# Patient Record
Sex: Male | Born: 1990 | Race: White | Hispanic: No | Marital: Single | Smoking: Never smoker
Health system: Southern US, Community
[De-identification: ages and names within clinical notes are randomized; demographics above are authoritative.]

---

## 2018-01-11 ENCOUNTER — Emergency Department (HOSPITAL_COMMUNITY): Payer: Self-pay | Admitting: Anesthesiology

## 2018-01-11 ENCOUNTER — Encounter (HOSPITAL_COMMUNITY)
Admission: EM | Disposition: A | Discharge status: Home / Self Care | Payer: Self-pay | Source: Home / Self Care | Attending: Emergency Medicine

## 2018-01-11 ENCOUNTER — Other Ambulatory Visit: Payer: Self-pay

## 2018-01-11 ENCOUNTER — Emergency Department (HOSPITAL_COMMUNITY): Payer: Self-pay

## 2018-01-11 ENCOUNTER — Encounter (HOSPITAL_COMMUNITY): Payer: Self-pay | Admitting: *Deleted

## 2018-01-11 ENCOUNTER — Ambulatory Visit (HOSPITAL_COMMUNITY)
Admission: EM | Admit: 2018-01-11 | Discharge: 2018-01-11 | Disposition: A | Discharge status: Home / Self Care | Payer: Self-pay | Attending: Emergency Medicine | Admitting: Emergency Medicine

## 2018-01-11 DIAGNOSIS — S3994XA Unspecified injury of external genitals, initial encounter: Secondary | ICD-10-CM

## 2018-01-11 DIAGNOSIS — S71132A Puncture wound without foreign body, left thigh, initial encounter: Secondary | ICD-10-CM | POA: Insufficient documentation

## 2018-01-11 DIAGNOSIS — S3123XA Puncture wound without foreign body of penis, initial encounter: Secondary | ICD-10-CM | POA: Insufficient documentation

## 2018-01-11 DIAGNOSIS — W3400XA Accidental discharge from unspecified firearms or gun, initial encounter: Secondary | ICD-10-CM | POA: Insufficient documentation

## 2018-01-11 HISTORY — PX: MEATOTOMY: SHX5133

## 2018-01-11 LAB — I-STAT CHEM 8, ED
BUN: 17 mg/dL (ref 6–20)
CALCIUM ION: 1.07 mmol/L — AB (ref 1.15–1.40)
CHLORIDE: 102 mmol/L (ref 98–111)
Creatinine, Ser: 1 mg/dL (ref 0.61–1.24)
Glucose, Bld: 188 mg/dL — ABNORMAL HIGH (ref 70–99)
HEMATOCRIT: 44 % (ref 39.0–52.0)
Hemoglobin: 15 g/dL (ref 13.0–17.0)
POTASSIUM: 3 mmol/L — AB (ref 3.5–5.1)
SODIUM: 138 mmol/L (ref 135–145)
TCO2: 21 mmol/L — ABNORMAL LOW (ref 22–32)

## 2018-01-11 LAB — HEMOGLOBIN: Hemoglobin: 13.3 g/dL (ref 13.0–17.0)

## 2018-01-11 SURGERY — MEATOTOMY, URETHRA, ADULT
Anesthesia: General

## 2018-01-11 MED ORDER — ROCURONIUM BROMIDE 50 MG/5ML IV SOSY
PREFILLED_SYRINGE | INTRAVENOUS | Status: AC
  Filled 2018-01-11: qty 5

## 2018-01-11 MED ORDER — LIDOCAINE 2% (20 MG/ML) 5 ML SYRINGE
INTRAMUSCULAR | Status: DC | PRN
  Administered 2018-01-11: 60 mg via INTRAVENOUS

## 2018-01-11 MED ORDER — ARTIFICIAL TEARS OPHTHALMIC OINT
TOPICAL_OINTMENT | OPHTHALMIC | Status: AC
  Filled 2018-01-11: qty 3.5

## 2018-01-11 MED ORDER — FENTANYL CITRATE (PF) 250 MCG/5ML IJ SOLN
INTRAMUSCULAR | Status: AC
  Filled 2018-01-11: qty 5

## 2018-01-11 MED ORDER — ONDANSETRON HCL 4 MG/2ML IJ SOLN
INTRAMUSCULAR | Status: DC | PRN
  Administered 2018-01-11: 4 mg via INTRAVENOUS

## 2018-01-11 MED ORDER — SUCCINYLCHOLINE CHLORIDE 20 MG/ML IJ SOLN
INTRAMUSCULAR | Status: DC | PRN
  Administered 2018-01-11: 120 mg via INTRAVENOUS

## 2018-01-11 MED ORDER — FENTANYL CITRATE (PF) 100 MCG/2ML IJ SOLN
25.0000 ug | INTRAMUSCULAR | Status: DC | PRN
  Administered 2018-01-11: 50 ug via INTRAVENOUS

## 2018-01-11 MED ORDER — OXYCODONE HCL 5 MG PO TABS: 5.0000 mg | ORAL_TABLET | Freq: Once | ORAL | Status: DC | PRN

## 2018-01-11 MED ORDER — LIDOCAINE 2% (20 MG/ML) 5 ML SYRINGE
INTRAMUSCULAR | Status: AC
  Filled 2018-01-11: qty 5

## 2018-01-11 MED ORDER — PROPOFOL 10 MG/ML IV BOLUS
INTRAVENOUS | Status: AC
  Filled 2018-01-11: qty 20

## 2018-01-11 MED ORDER — BACITRACIN-NEOMYCIN-POLYMYXIN OINTMENT TUBE
TOPICAL_OINTMENT | CUTANEOUS | Status: DC | PRN
  Administered 2018-01-11: 1 via TOPICAL

## 2018-01-11 MED ORDER — MIDAZOLAM HCL 5 MG/5ML IJ SOLN
INTRAMUSCULAR | Status: DC | PRN
  Administered 2018-01-11 (×2): 1 mg via INTRAVENOUS

## 2018-01-11 MED ORDER — OXYCODONE-ACETAMINOPHEN 5-325 MG PO TABS: 1.0000 | ORAL_TABLET | Freq: Four times a day (QID) | ORAL | 0 refills | Status: AC | PRN

## 2018-01-11 MED ORDER — POTASSIUM CHLORIDE CRYS ER 20 MEQ PO TBCR: 40.0000 meq | EXTENDED_RELEASE_TABLET | Freq: Once | ORAL | Status: DC

## 2018-01-11 MED ORDER — CEFAZOLIN SODIUM-DEXTROSE 2-4 GM/100ML-% IV SOLN
INTRAVENOUS | Status: AC
  Filled 2018-01-11: qty 100

## 2018-01-11 MED ORDER — SUCCINYLCHOLINE CHLORIDE 200 MG/10ML IV SOSY
PREFILLED_SYRINGE | INTRAVENOUS | Status: AC
  Filled 2018-01-11: qty 10

## 2018-01-11 MED ORDER — LACTATED RINGERS IV SOLN
INTRAVENOUS | Status: DC
  Administered 2018-01-11: 19:00:00 via INTRAVENOUS

## 2018-01-11 MED ORDER — OXYCODONE-ACETAMINOPHEN 5-325 MG PO TABS
2.0000 | ORAL_TABLET | Freq: Once | ORAL | Status: AC
  Administered 2018-01-11: 2 via ORAL
  Filled 2018-01-11: qty 2

## 2018-01-11 MED ORDER — MIDAZOLAM HCL 2 MG/2ML IJ SOLN
INTRAMUSCULAR | Status: AC
  Filled 2018-01-11: qty 2

## 2018-01-11 MED ORDER — FENTANYL CITRATE (PF) 100 MCG/2ML IJ SOLN
INTRAMUSCULAR | Status: DC | PRN
  Administered 2018-01-11 (×3): 50 ug via INTRAVENOUS

## 2018-01-11 MED ORDER — CEFAZOLIN (ANCEF) 1 G IV SOLR: 2.0000 g | INTRAVENOUS | Status: DC

## 2018-01-11 MED ORDER — PROPOFOL 10 MG/ML IV BOLUS
INTRAVENOUS | Status: DC | PRN
  Administered 2018-01-11: 180 mg via INTRAVENOUS

## 2018-01-11 MED ORDER — ONDANSETRON HCL 4 MG/2ML IJ SOLN: 4.0000 mg | Freq: Four times a day (QID) | INTRAMUSCULAR | Status: DC | PRN

## 2018-01-11 MED ORDER — LACTATED RINGERS IV SOLN
INTRAVENOUS | Status: DC | PRN
  Administered 2018-01-11: 19:00:00 via INTRAVENOUS

## 2018-01-11 MED ORDER — FENTANYL CITRATE (PF) 100 MCG/2ML IJ SOLN
INTRAMUSCULAR | Status: AC
  Filled 2018-01-11: qty 2

## 2018-01-11 MED ORDER — BACITRACIN-NEOMYCIN-POLYMYXIN 400-5-5000 EX OINT
TOPICAL_OINTMENT | CUTANEOUS | Status: AC
  Filled 2018-01-11: qty 1

## 2018-01-11 MED ORDER — 0.9 % SODIUM CHLORIDE (POUR BTL) OPTIME
TOPICAL | Status: DC | PRN
  Administered 2018-01-11: 1000 mL

## 2018-01-11 MED ORDER — CEFAZOLIN SODIUM-DEXTROSE 2-3 GM-%(50ML) IV SOLR
INTRAVENOUS | Status: DC | PRN
  Administered 2018-01-11: 2 g via INTRAVENOUS

## 2018-01-11 MED ORDER — DEXAMETHASONE SODIUM PHOSPHATE 10 MG/ML IJ SOLN
INTRAMUSCULAR | Status: DC | PRN
  Administered 2018-01-11: 10 mg via INTRAVENOUS

## 2018-01-11 MED ORDER — FENTANYL CITRATE (PF) 100 MCG/2ML IJ SOLN
100.0000 ug | Freq: Once | INTRAMUSCULAR | Status: AC
  Administered 2018-01-11: 100 ug via INTRAVENOUS
  Filled 2018-01-11: qty 2

## 2018-01-11 MED ORDER — OXYCODONE HCL 5 MG/5ML PO SOLN: 5.0000 mg | Freq: Once | ORAL | Status: DC | PRN

## 2018-01-11 MED ORDER — TETANUS-DIPHTH-ACELL PERTUSSIS 5-2.5-18.5 LF-MCG/0.5 IM SUSP
0.5000 mL | Freq: Once | INTRAMUSCULAR | Status: AC
  Administered 2018-01-11: 0.5 mL via INTRAMUSCULAR
  Filled 2018-01-11: qty 0.5

## 2018-01-11 SURGICAL SUPPLY — 26 items
BNDG GAUZE ELAST 4 BULKY (GAUZE/BANDAGES/DRESSINGS) ×3 IMPLANT
DRSG TELFA 3X8 NADH (GAUZE/BANDAGES/DRESSINGS) ×3 IMPLANT
ELECT REM PT RETURN 9FT ADLT (ELECTROSURGICAL) ×3
ELECTRODE REM PT RTRN 9FT ADLT (ELECTROSURGICAL) ×1 IMPLANT
GAUZE SPONGE 4X4 12PLY STRL (GAUZE/BANDAGES/DRESSINGS) ×3 IMPLANT
GLOVE BIO SURGEON STRL SZ7.5 (GLOVE) ×3 IMPLANT
GLOVE BIOGEL PI IND STRL 6.5 (GLOVE) ×1 IMPLANT
GLOVE BIOGEL PI IND STRL 7.5 (GLOVE) ×1 IMPLANT
GLOVE BIOGEL PI INDICATOR 6.5 (GLOVE) ×2
GLOVE BIOGEL PI INDICATOR 7.5 (GLOVE) ×2
GLOVE INDICATOR 7.0 STRL GRN (GLOVE) ×3 IMPLANT
GLOVE SURG SS PI 7.0 STRL IVOR (GLOVE) ×3 IMPLANT
GOWN STRL REUS W/ TWL LRG LVL3 (GOWN DISPOSABLE) ×1 IMPLANT
GOWN STRL REUS W/ TWL XL LVL3 (GOWN DISPOSABLE) ×1 IMPLANT
GOWN STRL REUS W/TWL LRG LVL3 (GOWN DISPOSABLE) ×2
GOWN STRL REUS W/TWL XL LVL3 (GOWN DISPOSABLE) ×2
KIT BASIN OR (CUSTOM PROCEDURE TRAY) ×3 IMPLANT
KIT TURNOVER KIT B (KITS) ×3 IMPLANT
NS IRRIG 1000ML POUR BTL (IV SOLUTION) ×3 IMPLANT
PACK GENERAL/GYN (CUSTOM PROCEDURE TRAY) ×3 IMPLANT
SUT VIC AB 4-0 SH 27 (SUTURE) ×2
SUT VIC AB 4-0 SH 27XBRD (SUTURE) ×1 IMPLANT
TAPE CLOTH SURG 6X10 WHT LF (GAUZE/BANDAGES/DRESSINGS) ×3 IMPLANT
TOWEL NATURAL 10PK STERILE (DISPOSABLE) ×3 IMPLANT
TRAY FOLEY W/BAG SLVR 14FR (SET/KITS/TRAYS/PACK) ×3 IMPLANT
WATER STERILE IRR 1000ML POUR (IV SOLUTION) ×3 IMPLANT
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
## 2018-01-11 NOTE — ED Notes (Signed)
BP in LUE 123/77 BP in LLE 157/73    Left ABI: 1.276    BP RLE 146/72 BP RUE 123/70  Right ABI: 1.186

## 2018-01-11 NOTE — Progress Notes (Signed)
   Pt soaking thigh dressing in PACU. I went and spoke with Dr. Wyatt who wilLindie Sprucel look at pt when he gets out of OR. Dr. Lindie SpruceWyatt ordered an H/H.
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
## 2018-01-11 NOTE — Progress Notes (Signed)
D; notified MD. Lindie SpruceWyatt Hgb 13.3. Via phone

## 2018-01-11 NOTE — Anesthesia Procedure Notes (Signed)
Procedure Name: Intubation Date/Time: 01/11/2018 7:11 PM Performed by: Edmonia CaprioAuston, Walid Haig M, CRNA Pre-anesthesia Checklist: Emergency Drugs available, Patient identified, Suction available, Patient being monitored and Timeout performed Patient Re-evaluated:Patient Re-evaluated prior to induction Oxygen Delivery Method: Circle system utilized Preoxygenation: Pre-oxygenation with 100% oxygen Induction Type: IV induction, Rapid sequence and Cricoid Pressure applied Laryngoscope Size: Miller and 2 Grade View: Grade I Tube type: Oral Tube size: 7.5 mm Number of attempts: 1 Airway Equipment and Method: Stylet Placement Confirmation: ETT inserted through vocal cords under direct vision,  positive ETCO2 and breath sounds checked- equal and bilateral Secured at: 21 cm Tube secured with: Tape Dental Injury: Teeth and Oropharynx as per pre-operative assessment
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
## 2018-01-11 NOTE — OR Nursing (Signed)
Left thigh wound washed and cleaned with betadine solution. Dressing applied using aseptic technique.

## 2018-01-11 NOTE — Progress Notes (Signed)
Patient request that restricted status be removed.
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
## 2018-01-11 NOTE — Transfer of Care (Signed)
Immediate Anesthesia Transfer of Care Note  Patient: Christopher Valenzuela  Procedure(s) Performed: MEATOPLASTY ADULT, GLANSPLASTY (N/A )  Patient Location: PACU  Anesthesia Type:General  Level of Consciousness: awake, alert  and oriented  Airway & Oxygen Therapy: Patient Spontanous Breathing  Post-op Assessment: Report given to RN and Post -op Vital signs reviewed and stable  Post vital signs: Reviewed and stable  Last Vitals:  Vitals Value Taken Time  BP 140/83 01/11/2018  8:12 PM  Temp    Pulse 105 01/11/2018  8:14 PM  Resp 19 01/11/2018  8:14 PM  SpO2 100 % 01/11/2018  8:14 PM  Vitals shown include unvalidated device data.  Last Pain:  Vitals:   01/11/18 1818  TempSrc:   PainSc: 2          Complications: No apparent anesthesia complications
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
## 2018-01-11 NOTE — Progress Notes (Signed)
D; Dr. Lindie SpruceWyatt checked dressing @ Lt Thigh, and press dressing with 4X4 and Ace wrap.
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
## 2018-01-11 NOTE — Discharge Instructions (Signed)
Incision Care, Adult Incision care  Follow instructions from your doctor about how to take care of your cut. Make sure you: ? Wash your hands with soap and water before you change your bandage (dressing). If you cannot use soap and water, use hand sanitizer. ? Change your bandage as told by your doctor. ? Leave stitches, skin glue, or skin tape in place.   Check your cut area every day for signs of infection. Check for: ? More redness, swelling, or pain. ? More fluid or blood. ? Warmth. ? Pus or a bad smell.  You may shower and get the incision wet, but don't spray it directly.  ? Using mild soap and water. ? Using a clean towel to pat the cut dry after you clean it. ? You may put a small amount of neosporin (antibiotic ointment) at the tip of the penis on the catheter for lubrication.  ? Covering the thigh wounds with non-stick dressing, gauze and a loose wrap daily. Wash around the wounds with warm soap and water.   Do not take baths, swim, or use a hot tub until the incision and thigh wounds are completely healed.  Medicines  Take over-the-counter acetamionphen or ibuprofen for pain as directed on the bottle.  General instructions  Limit movement around your cut. This helps healing. ? Avoid straining, lifting, or exercise for the first month, or for as long as told by your doctor. ? Follow instructions from your doctor about going back to your normal activities. ? Ask your doctor what activities are safe.  Keep all follow-up visits as told by your doctor. This is important. Contact a doctor if:  Your have more redness, swelling, or pain around the cut.  You have more fluid or blood coming from the cut.  Your cut feels warm to the touch.  You have pus or a bad smell coming from the cut.  You have a fever or shaking chills.  You feel sick to your stomach (nauseous) or you throw up (vomit).  You are dizzy.  Your stitches or staples come undone. Get help right away  if:  You have a red streak coming from your cut.  Your cut bleeds through the bandage and the bleeding does not stop with gentle pressure.  The edges of your cut open up and separate.  You have very bad (severe) pain.  You have a rash.  You are confused.  You pass out (faint).  You have trouble breathing and you have a fast heartbeat. This information is not intended to replace advice given to you by your health care provider. Make sure you discuss any questions you have with your health care provider. Document Released: 07/30/2011 Document Revised: 01/13/2016 Document Reviewed: 01/13/2016 Elsevier Interactive Patient Education  2017 [employer]   Indwelling Urinary Catheter Care, Adult Take good care of your catheter to keep it working and to prevent problems. How to wear your catheter Attach your catheter to your leg with tape (adhesive tape) or a leg strap. Make sure it is not too tight. If you use tape, remove any bits of tape that are already on the catheter. How to wear a drainage bag You should have:  A large overnight bag.  A small leg bag.  Overnight Bag You may wear the overnight bag at any time. Always keep the bag below the level of your bladder but off the floor. When you sleep, put a clean plastic bag in a wastebasket. Then hang the bag  inside the wastebasket. Leg Bag Never wear the leg bag at night. Always wear the leg bag below your knee. Keep the leg bag secure with a leg strap or tape. How to care for your skin  Clean the skin around the catheter at least once every day.  Shower every day. Do not take baths.  Put creams, lotions, or ointments on your genital area only as told by your doctor.  Do not use powders, sprays, or lotions on your genital area. How to clean your catheter and your skin 1. Wash your hands with soap and water. 2. Wet a washcloth in warm water and gentle (mild) soap. 3. Use the washcloth to clean the skin where the catheter  enters your body. Clean downward and wipe away from the catheter in small circles. Do not wipe toward the catheter. 4. Pat the area dry with a clean towel. Make sure to clean off all soap. How to care for your drainage bags Empty your drainage bag when it is ?- full or at least 2-3 times a day. Replace your drainage bag once a month or sooner if it starts to smell bad or look dirty. Do not clean your drainage bag unless told by your doctor. Emptying a drainage bag  Supplies Needed  Rubbing alcohol.  Gauze pad or cotton ball.  Tape or a leg strap.  Steps 1. Wash your hands with soap and water. 2. Separate (detach) the bag from your leg. 3. Hold the bag over the toilet or a clean container. Keep the bag below your hips and bladder. This stops pee (urine) from going back into the tube. 4. Open the pour spout at the bottom of the bag. 5. Empty the pee into the toilet or container. Do not let the pour spout touch any surface. 6. Put rubbing alcohol on a gauze pad or cotton ball. 7. Use the gauze pad or cotton ball to clean the pour spout. 8. Close the pour spout. 9. Attach the bag to your leg with tape or a leg strap. 10. Wash your hands.  Changing a drainage bag Supplies Needed  Alcohol wipes.  A clean drainage bag.  Adhesive tape or a leg strap.  Steps 1. Wash your hands with soap and water. 2. Separate the dirty bag from your leg. 3. Pinch the rubber catheter with your fingers so that pee does not spill out. 4. Separate the catheter tube from the drainage tube where these tubes connect (at the connection valve). Do not let the tubes touch any surface. 5. Clean the end of the catheter tube with an alcohol wipe. Use a different alcohol wipe to clean the end of the drainage tube. 6. Connect the catheter tube to the drainage tube of the clean bag. 7. Attach the new bag to the leg with adhesive tape or a leg strap. 8. Wash your hands.  How to prevent infection and other  problems  Never pull on your catheter or try to remove it. Pulling can damage tissue in your body.  Always wash your hands before and after touching your catheter.  If a leg strap gets wet, replace it with a dry one.  Drink enough fluids to keep your pee clear or pale yellow, or as told by your doctor.  Do not let the drainage bag or tubing touch the floor.  Wear cotton underwear.  If you are male, wipe from front to back after you poop (have a bowel movement).  Check on the catheter  often to make sure it works and the tubing is not twisted. Get help if:  Your pee is cloudy.  Your pee smells unusually bad.  Your pee is not draining into the bag.  Your tube gets clogged.  Your catheter starts to leak.  Your bladder feels full. Get help right away if:  You have redness, swelling, or pain where the catheter enters your body.  You have fluid, pus, or a bad smell coming from the area where the catheter enters your body.  The area where the catheter enters your body feels warm.  You have a fever.  You have pain in your: ? Stomach (abdomen). ? Legs. ? Lower back. ? Bladder.  You see blood fill the catheter.  Your pee is pink or red.  You feel sick to your stomach (nauseous).  You throw up (vomit).  You have chills.  Your catheter gets pulled out. This information is not intended to replace advice given to you by your health care provider. Make sure you discuss any questions you have with your health care provider. Document Released: 09/01/2012 Document Revised: 04/04/2016 Document Reviewed: 10/20/2013 Elsevier Interactive Patient Education  [employer]2018 [employer]
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
## 2018-01-13 ENCOUNTER — Encounter (HOSPITAL_COMMUNITY): Payer: Self-pay | Admitting: Urology

## 2018-01-13 NOTE — Anesthesia Postprocedure Evaluation (Signed)
Anesthesia Post Note  Patient: Christopher Valenzuela  Procedure(s) Performed: MEATOPLASTY ADULT, GLANSPLASTY (N/A )     Patient location during evaluation: PACU Anesthesia Type: General Level of consciousness: awake and alert Pain management: pain level controlled Vital Signs Assessment: post-procedure vital signs reviewed and stable Respiratory status: spontaneous breathing, nonlabored ventilation, respiratory function stable and patient connected to nasal cannula oxygen Cardiovascular status: blood pressure returned to baseline and stable Postop Assessment: no apparent nausea or vomiting Anesthetic complications: no    Last Vitals:  Vitals:   01/11/18 2230 01/11/18 2300  BP: 137/86 137/79  Pulse: 75 75  Resp: 17 17  Temp:  36.8 C  SpO2: 99% 99%    Last Pain:  Vitals:   01/11/18 2230  TempSrc:   PainSc: 1                  Patrik Turnbaugh S

## 2018-01-28 ENCOUNTER — Ambulatory Visit (INDEPENDENT_AMBULATORY_CARE_PROVIDER_SITE_OTHER): Payer: 59 | Admitting: Orthopaedic Surgery

## 2018-01-28 DIAGNOSIS — S71132A Puncture wound without foreign body, left thigh, initial encounter: Secondary | ICD-10-CM | POA: Diagnosis not present

## 2018-01-28 DIAGNOSIS — W3400XA Accidental discharge from unspecified firearms or gun, initial encounter: Secondary | ICD-10-CM
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
## 2018-02-04 ENCOUNTER — Telehealth (INDEPENDENT_AMBULATORY_CARE_PROVIDER_SITE_OTHER): Payer: Self-pay
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
## 2018-02-04 NOTE — Telephone Encounter (Signed)
Please advise 

## 2018-02-06 NOTE — Telephone Encounter (Signed)
appt made for tuesday

## 2018-02-11 ENCOUNTER — Ambulatory Visit (INDEPENDENT_AMBULATORY_CARE_PROVIDER_SITE_OTHER): Payer: 59 | Admitting: Orthopaedic Surgery

## 2018-02-11 ENCOUNTER — Encounter (INDEPENDENT_AMBULATORY_CARE_PROVIDER_SITE_OTHER): Payer: Self-pay | Admitting: Orthopaedic Surgery

## 2018-02-11 DIAGNOSIS — S71132A Puncture wound without foreign body, left thigh, initial encounter: Secondary | ICD-10-CM

## 2018-02-11 DIAGNOSIS — W3400XA Accidental discharge from unspecified firearms or gun, initial encounter: Secondary | ICD-10-CM
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
## 2018-02-25 ENCOUNTER — Ambulatory Visit (INDEPENDENT_AMBULATORY_CARE_PROVIDER_SITE_OTHER): Payer: 59 | Admitting: Orthopaedic Surgery
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
